# Patient Record
Sex: Male | Born: 1975 | Race: White | Hispanic: No | Marital: Single | Smoking: Current every day smoker
Health system: Southern US, Community
[De-identification: ages and names within clinical notes are randomized; demographics above are authoritative.]

---

## 2014-08-26 ENCOUNTER — Emergency Department (HOSPITAL_COMMUNITY): Payer: PRIVATE HEALTH INSURANCE

## 2014-08-26 ENCOUNTER — Emergency Department (HOSPITAL_COMMUNITY)
Admission: EM | Admit: 2014-08-26 | Discharge: 2014-08-26 | Disposition: A | Discharge status: Home / Self Care | Payer: PRIVATE HEALTH INSURANCE | Attending: Emergency Medicine | Admitting: Emergency Medicine

## 2014-08-26 ENCOUNTER — Encounter (HOSPITAL_COMMUNITY): Payer: Self-pay | Admitting: *Deleted

## 2014-08-26 DIAGNOSIS — Z720 Tobacco use: Secondary | ICD-10-CM | POA: Insufficient documentation

## 2014-08-26 DIAGNOSIS — R000 Tachycardia, unspecified: Secondary | ICD-10-CM | POA: Insufficient documentation

## 2014-08-26 DIAGNOSIS — Y9289 Other specified places as the place of occurrence of the external cause: Secondary | ICD-10-CM | POA: Insufficient documentation

## 2014-08-26 DIAGNOSIS — S01512A Laceration without foreign body of oral cavity, initial encounter: Secondary | ICD-10-CM

## 2014-08-26 DIAGNOSIS — Y990 Civilian activity done for income or pay: Secondary | ICD-10-CM | POA: Insufficient documentation

## 2014-08-26 DIAGNOSIS — W228XXA Striking against or struck by other objects, initial encounter: Secondary | ICD-10-CM | POA: Insufficient documentation

## 2014-08-26 DIAGNOSIS — Z23 Encounter for immunization: Secondary | ICD-10-CM | POA: Insufficient documentation

## 2014-08-26 DIAGNOSIS — S0181XA Laceration without foreign body of other part of head, initial encounter: Secondary | ICD-10-CM | POA: Diagnosis present

## 2014-08-26 DIAGNOSIS — S01511A Laceration without foreign body of lip, initial encounter: Secondary | ICD-10-CM

## 2014-08-26 DIAGNOSIS — Y9389 Activity, other specified: Secondary | ICD-10-CM | POA: Diagnosis not present

## 2014-08-26 DIAGNOSIS — S0083XA Contusion of other part of head, initial encounter: Secondary | ICD-10-CM

## 2014-08-26 DIAGNOSIS — Z79899 Other long term (current) drug therapy: Secondary | ICD-10-CM | POA: Diagnosis not present

## 2014-08-26 MED ORDER — LIDOCAINE-EPINEPHRINE-TETRACAINE (LET) SOLUTION
3.0000 mL | Freq: Once | NASAL | Status: AC
Start: 2014-08-26 — End: 2014-08-26
  Administered 2014-08-26: 3 mL via TOPICAL
  Filled 2014-08-26: qty 3

## 2014-08-26 MED ORDER — LIDOCAINE HCL (PF) 1 % IJ SOLN
INTRAMUSCULAR | Status: AC
  Filled 2014-08-26: qty 5

## 2014-08-26 MED ORDER — TETANUS-DIPHTH-ACELL PERTUSSIS 5-2.5-18.5 LF-MCG/0.5 IM SUSP
0.5000 mL | Freq: Once | INTRAMUSCULAR | Status: AC
  Administered 2014-08-26: 0.5 mL via INTRAMUSCULAR
  Filled 2014-08-26: qty 0.5

## 2014-08-26 MED ORDER — HYDROCODONE-ACETAMINOPHEN 5-325 MG PO TABS: 1.0000 | ORAL_TABLET | ORAL | Status: AC | PRN

## 2014-08-26 MED ORDER — OXYCODONE-ACETAMINOPHEN 5-325 MG PO TABS
1.0000 | ORAL_TABLET | Freq: Once | ORAL | Status: AC
  Administered 2014-08-26: 1 via ORAL
  Filled 2014-08-26: qty 1

## 2014-08-26 NOTE — ED Notes (Signed)
Patient kicked leg up at work which caused a wrench off his toolbag to fly out, hitting him just above R upper lip.

## 2014-08-26 NOTE — ED Notes (Signed)
Pt given cup of normal saline and instructed to swish out mouth and spit- per NP instructions

## 2014-08-26 NOTE — Discharge Instructions (Signed)
Your CT scan shows no broken bones. Continue to rinse your mouth with salt water. Return as needed for any problems.

## 2014-08-26 NOTE — ED Notes (Signed)
Pt given discharge instructions - declined work note-- instructed on demabond  Care- verbalized understanding .

## 2014-08-26 NOTE — ED Provider Notes (Signed)
CSN: [id number]     Arrival date & time 08/26/14  1716 History   First MD Initiated Contact with Patient 08/26/14 1828     Chief Complaint  Patient presents with  . Facial Laceration     (Consider location/radiation/quality/duration/timing/severity/associated sxs/prior Treatment) Patient is a 39 y.o. male presenting with skin laceration. The history is provided by the patient. No language interpreter was used.  Laceration Location:  Face and mouth Facial laceration location:  Lip Mouth laceration location:  Upper inner lip Bleeding: controlled with pressure   Laceration mechanism:  Metal edge (wrench)  Laiken Nohr is a 39 y.o. male who presents to the ED with a laceration to the upper lip. He states that while at work he kicked his leg up and caused a wrench off his toolbag to fly out and hit his upper lip.   History reviewed. No pertinent past medical history. History reviewed. No pertinent past surgical history. History reviewed. No pertinent family history. History  Substance Use Topics  . Smoking status: Current Every Day Smoker -- 1.00 packs/day    Types: Cigarettes  . Smokeless tobacco: Not on file  . Alcohol Use: Yes     Comment: occasionally    Review of Systems Negative except as stated in HPI   Allergies  Review of patient's allergies indicates no known allergies.  Home Medications   Prior to Admission medications   Medication Sig Start Date End Date Taking? Authorizing Provider  omeprazole (PRILOSEC) 20 MG capsule Take 20 mg by mouth daily.   Yes Historical Provider, MD  HYDROcodone-acetaminophen (NORCO/VICODIN) 5-325 MG per tablet Take 1 tablet by mouth every 4 (four) hours as needed. 08/26/14   Toron Bowring Orlene Och, NP   BP 144/97 mmHg  Pulse 94  Temp(Src) 98.7 F (37.1 C) (Oral)  Resp 18  Ht 6' (1.829 m)  Wt 220 lb (99.791 kg)  BMI 29.83 kg/m2  SpO2 100% Physical Exam  Constitutional: He is oriented to person, place, and time. He appears well-developed and  well-nourished. No distress.  HENT:  Head:    1 cm laceration to the right upper lip. Bleeding controlled. Tenderness to the maxilla with palpation.  Laceration to the inside of the right upper lip.   Eyes: Conjunctivae and EOM are normal.  Neck: Normal range of motion. Neck supple.  Cardiovascular: Tachycardia present.   Pulmonary/Chest: Effort normal.  Abdominal: There is tenderness.  Musculoskeletal: Normal range of motion.  Neurological: He is alert and oriented to person, place, and time. No cranial nerve deficit.  Skin: Skin is warm and dry.  Psychiatric: He has a normal mood and affect. His behavior is normal.  Nursing note and vitals reviewed.   ED Course  Procedures (including critical care time) LET applied to external laceration, wound cleaned. Closed with Dermabond. Patient tolerated procedure without any immediate complications.   Labs Review Labs Reviewed - No data to display  Imaging Review Ct Maxillofacial Wo Cm  08/26/2014   CLINICAL DATA:  39 year old male with abrasion to the left lower nostrils and left upper lip  EXAM: CT MAXILLOFACIAL WITHOUT CONTRAST  TECHNIQUE: Multidetector CT imaging of the maxillofacial structures was performed. Multiplanar CT image reconstructions were also generated. A small metallic BB was placed on the right temple in order to reliably differentiate right from left.  COMPARISON:  None.  FINDINGS: There is no fracture of the facial bone. The maxilla, mandible, and pterygoid plates appear unremarkable. The orbits and retro-orbital fat are preserved. There is a focal  lucency at the root of the right central and lateral maxillary incisor teeth, likely related to prior dental work. There is soft tissue swelling of the upper lip. No drainable fluid collection/abscess identified.  IMPRESSION: No acute fracture.   Electronically Signed   By: Elgie CollardArash  Radparvar M.D.   On: 08/26/2014 20:54    MDM  39 y.o. male with laceration to the right upper lip  and inside of the upper lip and contusion to the right side of the face s/p injury. Discussed with the patient clinical and CT findings and plan of care. all questioned fully answered. He will call me if any problems arise.  Final diagnoses:  Laceration of lip, initial encounter  Laceration of mouth, initial encounter  Facial contusion, initial encounter     [facility] Jisel Fleet, NP 08/26/14 2105  Bethann BerkshireJoseph Zammit, MD 08/30/14 1355

## 2014-08-26 NOTE — ED Notes (Signed)
LET applied to site - being held in place by pt

## 2015-05-16 ENCOUNTER — Inpatient Hospital Stay
Admit: 2015-05-16 | Discharge: 2015-05-16 | Disposition: A | Discharge status: Home / Self Care | Payer: Self-pay | Attending: Emergency Medicine

## 2015-05-16 DIAGNOSIS — T401X1A Poisoning by heroin, accidental (unintentional), initial encounter: Secondary | ICD-10-CM

## 2015-05-16 MED ORDER — ACETAMINOPHEN 500 MG TAB
500 mg | ORAL | Status: DC
Start: 2015-05-16 — End: 2015-05-16

## 2015-05-16 NOTE — ED Notes (Signed)
Pt resting easily arouses to voice. Visitor to bedside.

## 2015-05-16 NOTE — ED Provider Notes (Signed)
HPI Comments: Wayne Shaw is a 40 y.o. male with hx of gastrointestinal disorder who presents to the ED via EMS with complaint of a drug overdose PTA. He was given narcan per EMS. 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 Pt has overdosed in the past. He also complains of a headache. Denies nausea, vomiting, abdominal pain, chest pain, shortness of breath. There are no alleviating or exacerbating factors.     Patient is a 40 y.o. male presenting with Ingested Medication. The history is provided by the patient.   Drug Overdose   This is a new problem. The current episode started 1 to 2 hours ago. Associated symptoms include headaches. Pertinent negatives include no chest pain, no abdominal pain and no shortness of breath. Nothing aggravates the symptoms. Nothing relieves the symptoms. He has tried nothing for the symptoms. The treatment provided no relief.        Past Medical History:   Diagnosis Date   ??? Gastrointestinal disorder     reflix       History reviewed. No pertinent surgical history.      History reviewed. No pertinent family history.    Social History     Social History   ??? Marital status: SINGLE     Spouse name: N/A   ??? Number of children: N/A   ??? Years of education: N/A     Occupational History   ??? Not on file.     Social History Main Topics   ??? Smoking status: Current Every Day Smoker     Packs/day: 1.00   ??? Smokeless tobacco: Not on file   ??? Alcohol use No   ??? Drug use: Yes     Special: Heroin   ??? Sexual activity: Not on file     Other Topics Concern   ??? Not on file     Social History Narrative   ??? No narrative on file         ALLERGIES: Review of patient's allergies indicates no known allergies.    Review of Systems   Constitutional: Negative.  Negative for chills, fatigue and fever.   HENT: Negative for congestion.    Respiratory: Negative.  Negative for cough and shortness of breath.    Cardiovascular: Negative.  Negative for chest pain and palpitations.    Gastrointestinal: Negative.  Negative for abdominal pain, diarrhea, nausea and vomiting.   Genitourinary: Negative.  Negative for dysuria and hematuria.   Musculoskeletal: Negative.  Negative for arthralgias and myalgias.   Skin: Negative.  Negative for rash.   Neurological: Positive for headaches. Negative for weakness and numbness.   Hematological: Negative.  Does not bruise/bleed easily.   All other systems reviewed and are negative.      Vitals:    05/16/15 1552 05/16/15 1600 05/16/15 1630   BP: 121/75 128/65 110/77   Pulse: 87 98 79   Resp: 14 18 16    Temp: 97.3 ??F (36.3 ??C)     SpO2: 95% 96% 96%   Weight: 90.7 kg (200 lb)     Height: 6' (1.829 m)              Physical Exam   Constitutional: He is oriented to person, place, and time. He appears well-developed and well-nourished. No distress.   HENT:   Head: Normocephalic and atraumatic.   Eyes: Conjunctivae and EOM are normal. Pupils are equal, round, and reactive to light. Right eye exhibits no discharge. Left eye exhibits no discharge. No scleral icterus.  Neck: Normal range of motion. Neck supple. No tracheal deviation present.   Cardiovascular: Normal rate, regular rhythm and intact distal pulses.  Exam reveals no gallop and no friction rub.    No murmur heard.  Pulmonary/Chest: Effort normal and breath sounds normal. No stridor. No respiratory distress. He has no wheezes. He has no rales.   Abdominal: Soft. Bowel sounds are normal. He exhibits no distension. There is no tenderness. There is no rebound and no guarding.   Musculoskeletal: Normal range of motion. He exhibits no edema or tenderness.   Neurological: He is alert and oriented to person, place, and time. No cranial nerve deficit. He exhibits normal muscle tone.   Skin: Skin is warm and dry. He is not diaphoretic. No erythema.   Psychiatric: He has a normal mood and affect. His behavior is normal. Thought content normal.   Nursing note and vitals reviewed.       MDM   Number of Diagnoses or Management Options  Heroin overdose, accidental or unintentional, initial encounter:   Diagnosis management comments: 40 y.o. male with narcan administration following IV heroin use and complaint of generalized headache which is likely related to narcan. He otherwise appears well and has stable vital signs.       Plan:   SBIRT counselor speak with patient   Cardiac monitor  Tylenol for headache  Discharge in one hour  -----  4:13 PM  Documented by Milana Huntsman, acting as a scribe for Dr. Lanier Ensign, MD    PROVIDER ATTESTATION:  4:56 PM    The entirety of this note, signed by me, accurately reflects all works, treatments, procedures, and medical decision making performed by me, Lanier Ensign, MD.            Patient Progress  Patient progress: stable    ED Course       Lab Data:  Labs Reviewed - No data to display      ED Course:     4:56 PM  HA improved. Observed without rebound apnea. Declined SBIRT evaluation. Stable for discharge.  Procedures

## 2015-05-16 NOTE — ED Triage Notes (Signed)
Pt admits to heroin  Use.and to headache following narcan administration

## 2015-05-16 NOTE — ED Notes (Signed)
Wayne Shaw states that [place]. And that he was only trying to get a buzz and does not use heroin in a abusive way and does not need SBIRT'S assistance. [place] [place] he still refused assistance.

## 2015-05-16 NOTE — ED Notes (Signed)
Late note: pt aware of upcoming discharge  A&ox4 did not remain for d/c instructions. Visitor w/ pt @ time of departure,

## 2016-02-19 IMAGING — CT CT MAXILLOFACIAL W/O CM
3 of 5 series · 16 of 47 positions shown, 19 images · non-contrast
Comparison: None.

CLINICAL DATA: 39-year-old male with abrasion to the left lower
nostrils and left upper lip

EXAM:
CT MAXILLOFACIAL WITHOUT CONTRAST
TECHNIQUE: Multidetector CT imaging of the maxillofacial structures was
performed. Multiplanar CT image reconstructions were also generated.
A small metallic BB was placed on the right temple in order to
reliably differentiate right from left.

[Series 2: max soft 2.0 h31s · axial · 0.37mm/px · z∈[+58,+208]mm · 11 of 118 slices shown, 14 images]
[im 9/118  brain]
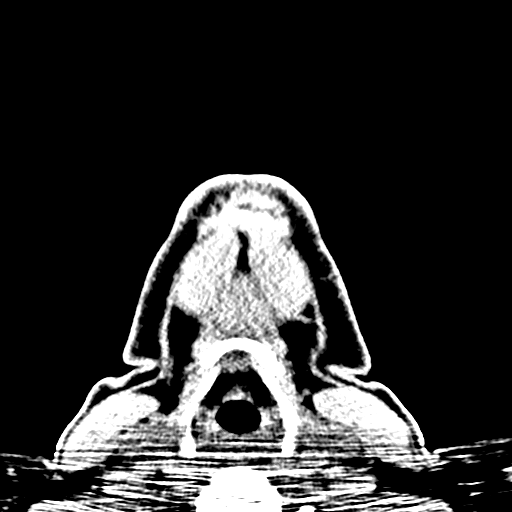
[im 9/118  bone]
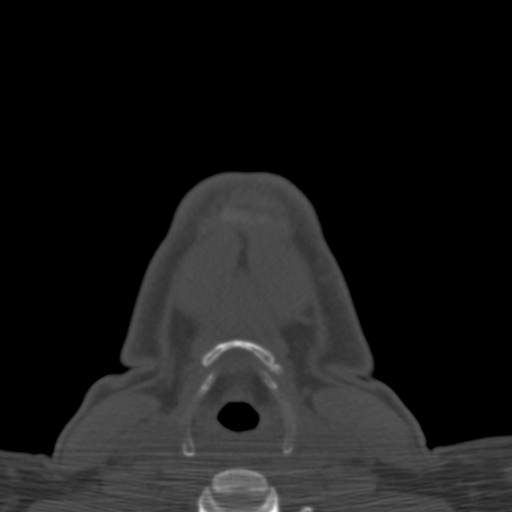
[im 17/118  bone]
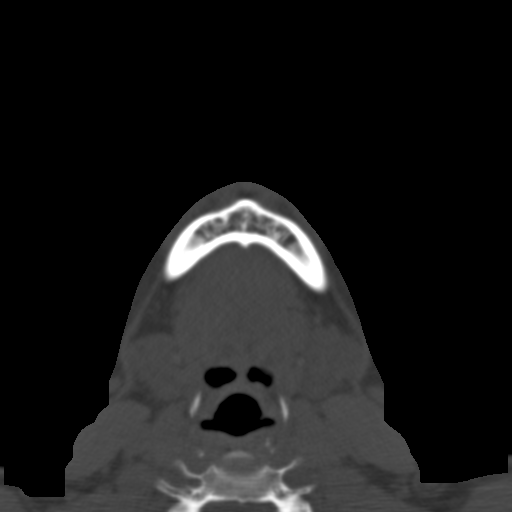
[im 29/118  bone]
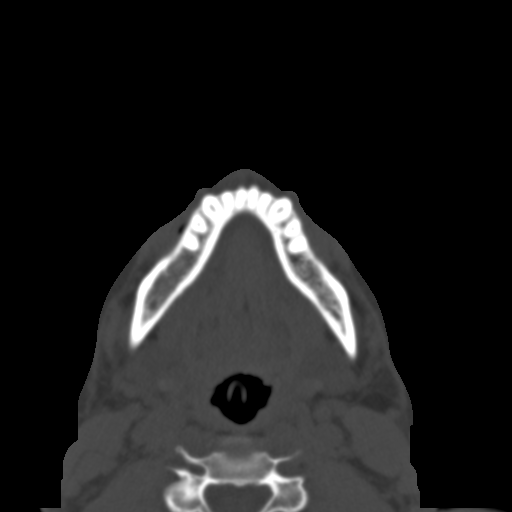
[im 37/118  bone]
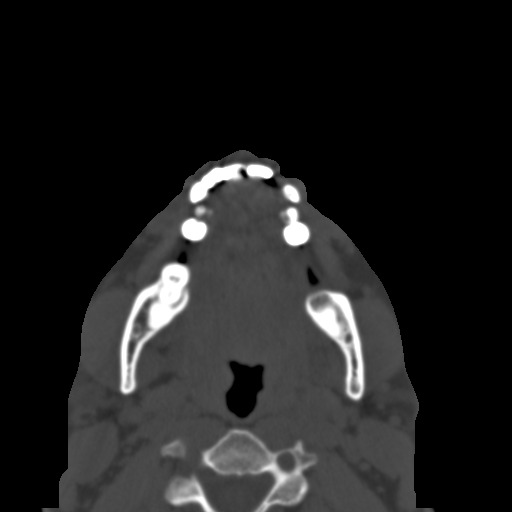
[im 49/118  brain]
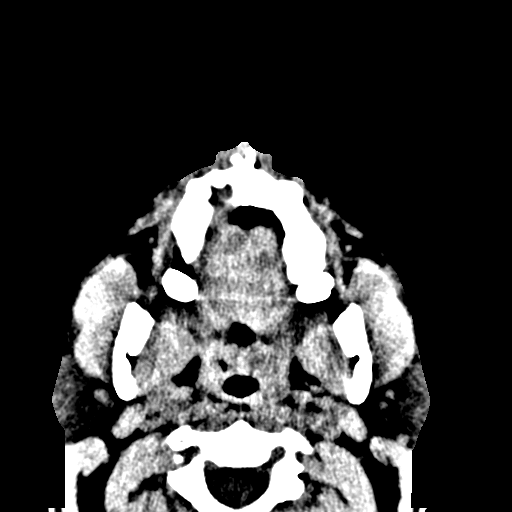
[im 49/118  bone]
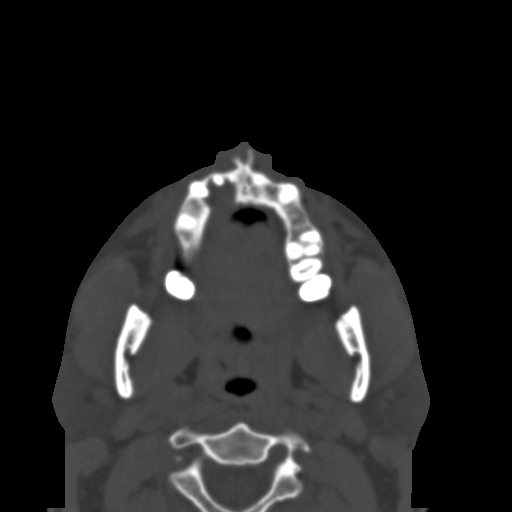
[im 61/118  bone]
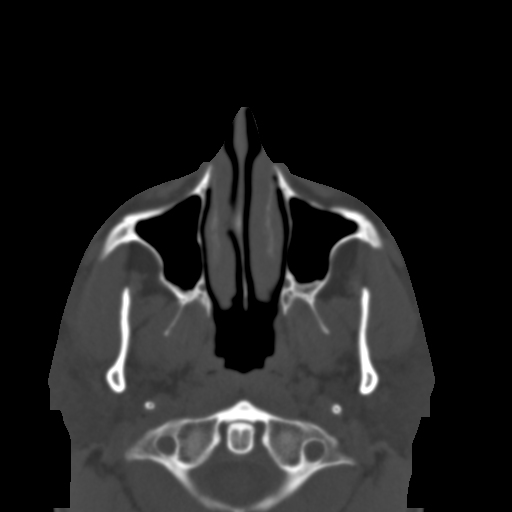
[im 69/118  bone]
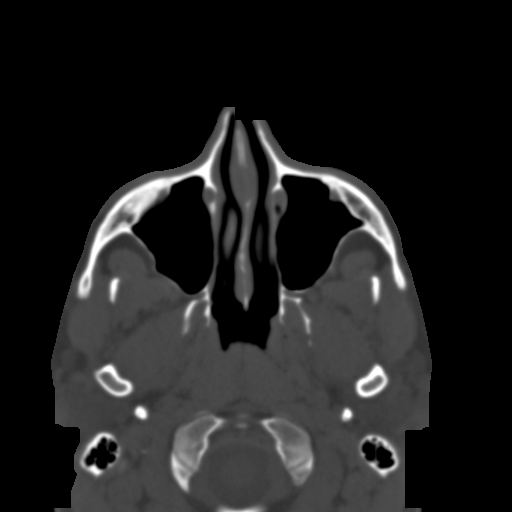
[im 81/118  bone]
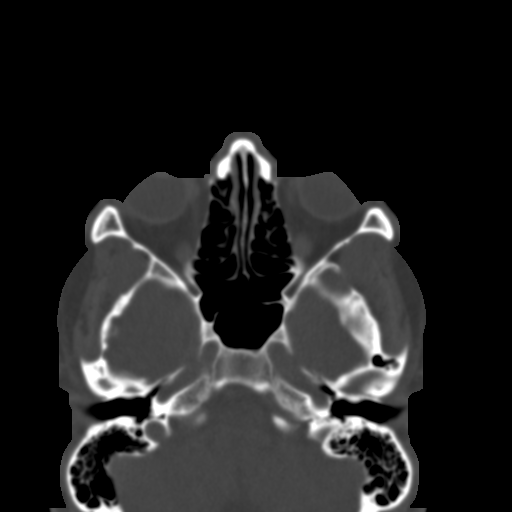
[im 89/118  brain]
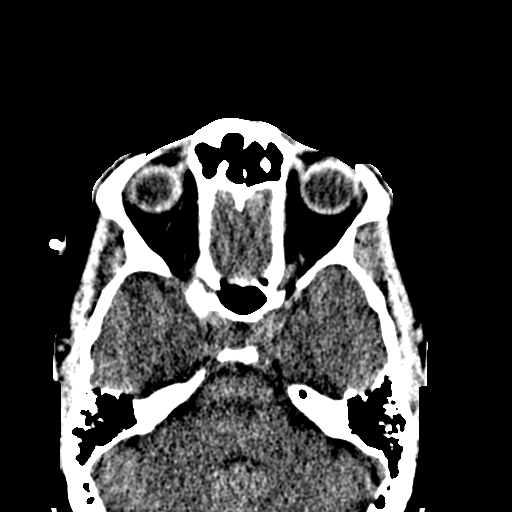
[im 89/118  bone]
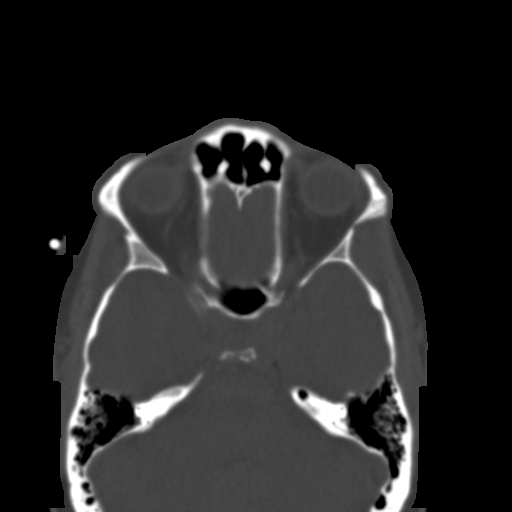
[im 101/118  bone]
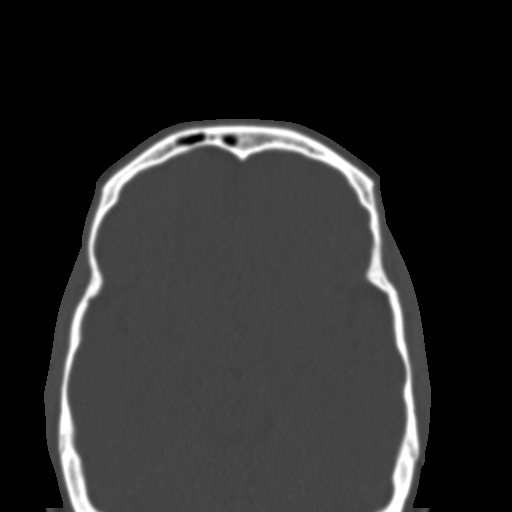
[im 109/118  bone]
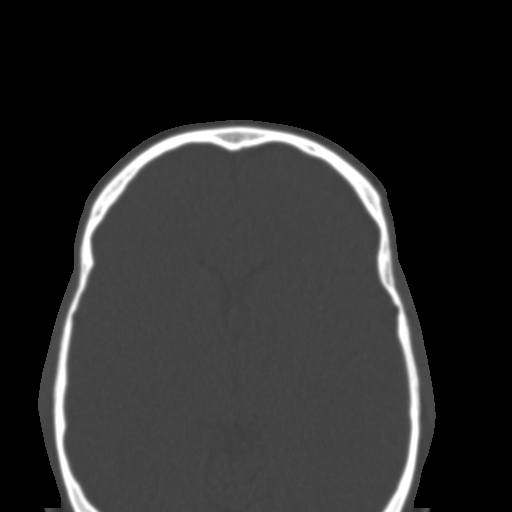

[Series 6: max bone coronal 2.0 spo · coronal · 0.35mm/px · 3 of 104 slices shown]
[im 21/104  bone]
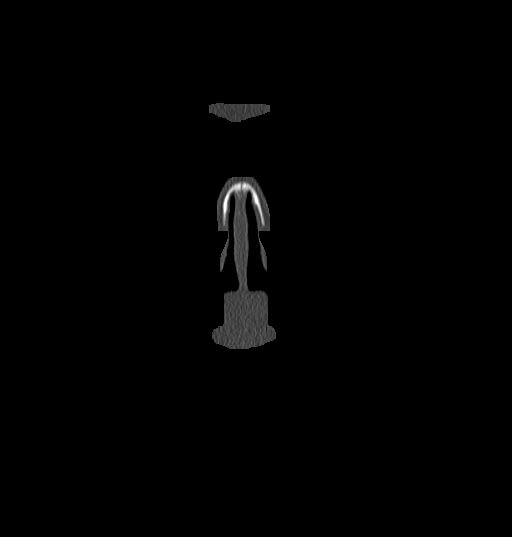
[im 42/104  bone]
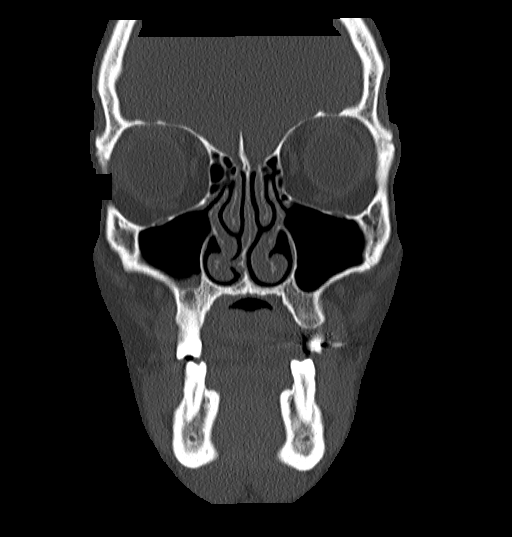
[im 62/104  bone]
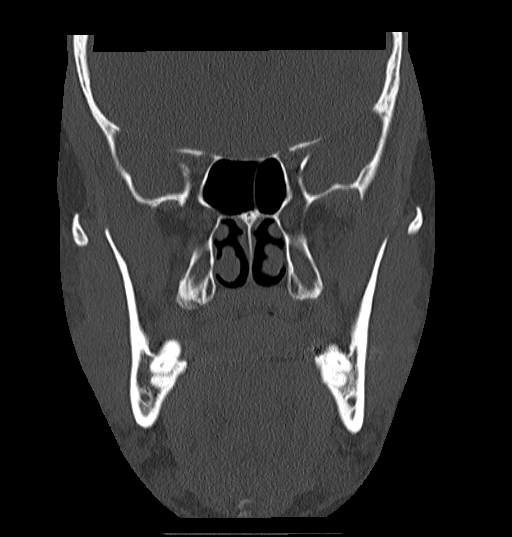

[Series 7: max bone sagittal 2.0 spo · sagittal · 0.32mm/px · 2 of 101 slices shown]
[im 34/101  bone]
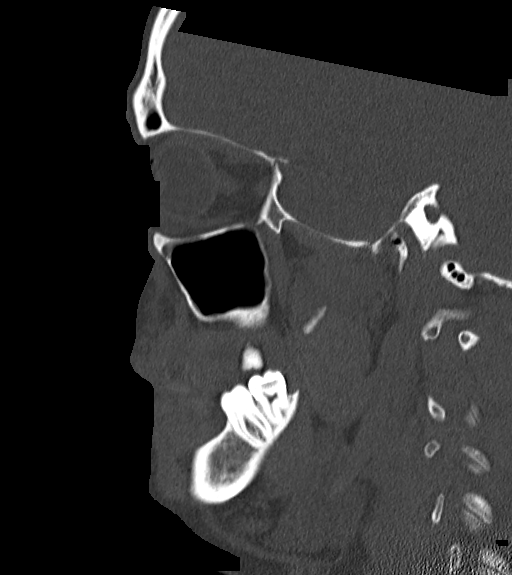
[im 67/101  bone]
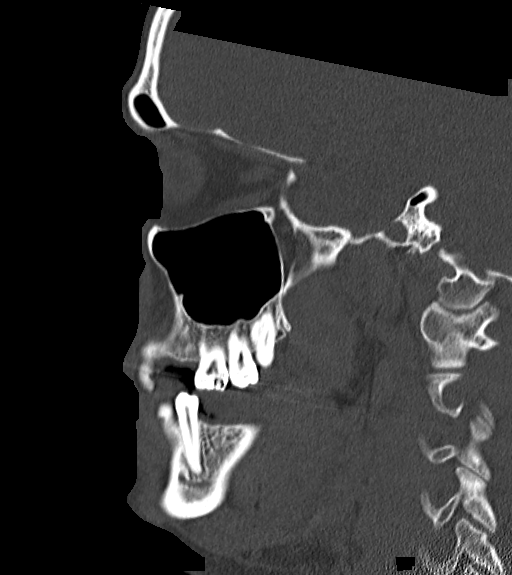

[16 of 47 positions shown; findings below may reference images not displayed]

FINDINGS: There is no fracture of the facial bone. The maxilla, mandible, and
pterygoid plates appear unremarkable. The orbits and retro-orbital
fat are preserved. There is a focal lucency at the root of the right
central and lateral maxillary incisor teeth, likely related to prior
dental work. There is soft tissue swelling of the upper lip. No
drainable fluid collection/abscess identified.
IMPRESSION: No acute fracture.
